# Patient Record
Sex: Female | Born: 1975 | Hispanic: Yes | Marital: Married | Smoking: Never smoker
Health system: Southern US, Community
[De-identification: ages and names within clinical notes are randomized; demographics above are authoritative.]

## PROBLEM LIST (undated history)

## (undated) DIAGNOSIS — E611 Iron deficiency: Secondary | ICD-10-CM

## (undated) DIAGNOSIS — E785 Hyperlipidemia, unspecified: Secondary | ICD-10-CM

## (undated) DIAGNOSIS — F419 Anxiety disorder, unspecified: Secondary | ICD-10-CM

## (undated) HISTORY — DX: Anxiety disorder, unspecified: F41.9

---

## 2000-06-06 HISTORY — PX: TUBAL LIGATION: SHX77

## 2004-05-14 ENCOUNTER — Ambulatory Visit: Payer: Self-pay

## 2007-04-19 ENCOUNTER — Ambulatory Visit: Payer: Self-pay | Admitting: Obstetrics and Gynecology

## 2007-04-30 ENCOUNTER — Ambulatory Visit: Payer: Self-pay | Admitting: Obstetrics and Gynecology

## 2008-05-15 ENCOUNTER — Ambulatory Visit: Payer: Self-pay | Admitting: Obstetrics and Gynecology

## 2008-10-15 ENCOUNTER — Ambulatory Visit: Payer: Self-pay | Admitting: Obstetrics and Gynecology

## 2009-08-04 ENCOUNTER — Ambulatory Visit: Payer: Self-pay | Admitting: Nephrology

## 2010-02-05 ENCOUNTER — Ambulatory Visit: Payer: Self-pay | Admitting: Family Medicine

## 2010-04-26 ENCOUNTER — Ambulatory Visit: Payer: Self-pay | Admitting: Unknown Physician Specialty

## 2010-09-21 ENCOUNTER — Emergency Department: Payer: Self-pay | Admitting: Emergency Medicine

## 2010-12-16 ENCOUNTER — Ambulatory Visit: Payer: Self-pay

## 2011-02-04 ENCOUNTER — Ambulatory Visit: Payer: Self-pay | Admitting: Family Medicine

## 2012-03-02 ENCOUNTER — Emergency Department: Payer: Self-pay | Admitting: Emergency Medicine

## 2012-03-02 LAB — COMPREHENSIVE METABOLIC PANEL
BUN: 10 mg/dL (ref 7–18)
Bilirubin,Total: 0.3 mg/dL (ref 0.2–1.0)
Chloride: 107 mmol/L (ref 98–107)
Co2: 26 mmol/L (ref 21–32)
Creatinine: 0.69 mg/dL (ref 0.60–1.30)
EGFR (African American): >60
EGFR (Non-African Amer.): >60
Osmolality: 283 (ref 275–301)
Potassium: 4.1 mmol/L (ref 3.5–5.1)
SGPT (ALT): 19 U/L (ref 12–78)

## 2012-03-02 LAB — URINALYSIS, COMPLETE
Bacteria: NONE SEEN
Bilirubin,UR: NEGATIVE
Ketone: NEGATIVE
Nitrite: NEGATIVE
Ph: 8 (ref 4.5–8.0)
Protein: NEGATIVE
RBC,UR: 4 /HPF (ref 0–5)
Squamous Epithelial: <1
WBC UR: <1 /HPF (ref 0–5)

## 2012-03-02 LAB — CBC
MCH: 30.8 pg (ref 26.0–34.0)
MCHC: 33.2 g/dL (ref 32.0–36.0)
RBC: 4.53 10*6/uL (ref 3.80–5.20)
WBC: 8.1 10*3/uL (ref 3.6–11.0)

## 2016-06-22 ENCOUNTER — Ambulatory Visit: Payer: Self-pay | Attending: Oncology

## 2016-07-18 ENCOUNTER — Ambulatory Visit: Payer: Self-pay | Attending: Oncology | Admitting: *Deleted

## 2016-07-18 ENCOUNTER — Ambulatory Visit
Admission: RE | Admit: 2016-07-18 | Discharge: 2016-07-18 | Disposition: A | Discharge status: Home / Self Care | Payer: Self-pay | Source: Ambulatory Visit | Attending: Oncology | Admitting: Oncology

## 2016-07-18 ENCOUNTER — Encounter (INDEPENDENT_AMBULATORY_CARE_PROVIDER_SITE_OTHER): Payer: Self-pay

## 2016-07-18 VITALS — BP 137/83 | HR 71 | Temp 97.6°F | Ht 62.21 in | Wt 131.0 lb

## 2016-07-18 DIAGNOSIS — N63 Unspecified lump in unspecified breast: Secondary | ICD-10-CM
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
## 2016-07-18 NOTE — Patient Instructions (Signed)
Gave patient hand-out, Women Staying Healthy, Active and Well from [facility], with education on breast health, pap smears, heart and colon health. 

## 2016-07-19 ENCOUNTER — Encounter: Payer: Self-pay | Admitting: *Deleted

## 2016-07-19 ENCOUNTER — Telehealth: Payer: Self-pay | Admitting: *Deleted

## 2016-07-19 NOTE — Telephone Encounter (Addendum)
Called patient via Myriam JacobsonHelen, the interpreter.  Left message for her to return my call.  I would like to discuss her normal mammogram results and offer a surgical consult for the palpable right breast mass.  Patient returned my call.  She would like a surgical consult.  She is scheduled to see Dr.  Evette CristalSankar on Thursday 07/21/16 at 2:30.  She is to arrive 30 minutes early to complete paperwork.  She is to take a photo ID and all her meds with her to the appointment.  Will follow-up per [facility] protocol.

## 2016-07-21 ENCOUNTER — Encounter: Payer: Self-pay | Admitting: General Surgery

## 2016-07-21 ENCOUNTER — Encounter: Payer: Self-pay | Admitting: *Deleted

## 2016-07-21 ENCOUNTER — Ambulatory Visit (INDEPENDENT_AMBULATORY_CARE_PROVIDER_SITE_OTHER): Payer: PRIVATE HEALTH INSURANCE | Admitting: General Surgery

## 2016-07-21 VITALS — BP 130/82 | HR 76 | Resp 12 | Ht 62.0 in | Wt 131.0 lb

## 2016-07-21 DIAGNOSIS — N6310 Unspecified lump in the right breast, unspecified quadrant: Secondary | ICD-10-CM | POA: Diagnosis not present

## 2016-07-21 NOTE — Progress Notes (Addendum)
Patient ID: Danielle Avery, female   DOB: 1976-05-02, 41 y.o.   MRN: [id number]  Chief Complaint  Patient presents with  . Breast Problem    HPI Danielle Avery is a 41 y.o. female.  who presents for a breast evaluation referred by Molli PoseySheena Lambert RN with [facility]. The most recent mammogram was done on 07-18-16 with ultrasound. She can feel something in the right breast noticed about several years ago. However, 2 months ago it started hurting, no nipple discharge. No pain at this time. The pain is a "squeezing" "twisting" pain. Right breast injury was about 5 years ago. Patient does perform regular self breast checks and does not get regular mammograms done.    Interpreter, Smithfield FoodsMarta Col, present for interview, exam and discussion. I have reviewed the history of present illness with the patient.  HPI  Past Medical History:  Diagnosis Date  . Anxiety     Past Surgical History:  Procedure Laterality Date  . TUBAL LIGATION  2002    Family History  Problem Relation Age of Onset  . Breast cancer Neg Hx     Social History Social History  Substance Use Topics  . Smoking status: Never Smoker  . Smokeless tobacco: Never Used  . Alcohol use No    Allergies  Allergen Reactions  . Penicillins Hives    Current Outpatient Prescriptions  Medication Sig Dispense Refill  . FLUoxetine (PROZAC) 40 MG capsule Take 40 mg by mouth daily.     Marland Kitchen. ibuprofen (ADVIL,MOTRIN) 400 MG tablet Take by mouth every 6 (six) hours as needed.      No current facility-administered medications for this visit.     Review of Systems Review of Systems  Constitutional: Negative.   Respiratory: Negative.   Cardiovascular: Negative.     Blood pressure 130/82, pulse 76, resp. rate 12, height 5\' 2"  (1.575 m), weight 131 lb (59.4 kg), last menstrual period 07/19/2016, SpO2 98 %.  Physical Exam Physical Exam  Constitutional: She is oriented to person, place, and time. She appears  well-developed and well-nourished.  HENT:  Mouth/Throat: Oropharynx is clear and moist.  Eyes: Conjunctivae are normal. No scleral icterus.  Neck: Neck supple.  Cardiovascular: Normal rate, regular rhythm and normal heart sounds.   Pulmonary/Chest: Effort normal and breath sounds normal. Right breast exhibits mass. Right breast exhibits no inverted nipple, no nipple discharge, no skin change and no tenderness. Left breast exhibits no inverted nipple, no mass, no nipple discharge, no skin change and no tenderness.    Lymphadenopathy:    She has no cervical adenopathy.    She has no axillary adenopathy.  Neurological: She is alert and oriented to person, place, and time.  Skin: Skin is warm and dry.  Psychiatric: Her behavior is normal.    Data Reviewed Mammogram and ultrasound reviewed  Assessment    Right breast mass. Normal imaging. By history lump has been there for few yrs. Only ecently she feels some intermittent pain. 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    Plan    Aleve or advil as needed for pain. Follow office visit in 5-6 months.   If pain becopmes more of an issue will plan for excision of the mass  This information has been scribed by Dorathy DaftMarsha Hatch RN, BSN,BC.   Benedict Kue G 08/16/2016, 3:35 PM

## 2016-07-21 NOTE — Patient Instructions (Addendum)
The patient is aware to call back for any questions or concerns. Aleve or advil as needed for pain. Follow office visit in 5-6 months.

## 2016-08-04 ENCOUNTER — Encounter: Payer: Self-pay | Admitting: *Deleted

## 2016-08-04 NOTE — Progress Notes (Signed)
Patient saw Dr. Evette CristalSankar and he agrees that findings are benign.  Patient to follow up with him in 6 months.

## 2016-10-25 ENCOUNTER — Other Ambulatory Visit: Payer: Self-pay | Admitting: Nurse Practitioner

## 2016-10-25 DIAGNOSIS — N6452 Nipple discharge: Secondary | ICD-10-CM

## 2016-10-26 ENCOUNTER — Other Ambulatory Visit: Payer: Self-pay | Admitting: Nurse Practitioner

## 2016-10-26 DIAGNOSIS — N6452 Nipple discharge: Secondary | ICD-10-CM

## 2016-11-01 ENCOUNTER — Ambulatory Visit
Admission: RE | Admit: 2016-11-01 | Discharge: 2016-11-01 | Disposition: A | Discharge status: Home / Self Care | Payer: No Typology Code available for payment source | Source: Ambulatory Visit | Attending: Nurse Practitioner | Admitting: Nurse Practitioner

## 2016-11-01 DIAGNOSIS — N6452 Nipple discharge: Secondary | ICD-10-CM

## 2016-12-14 ENCOUNTER — Encounter: Payer: Self-pay | Admitting: Emergency Medicine

## 2016-12-14 ENCOUNTER — Emergency Department
Admission: EM | Admit: 2016-12-14 | Discharge: 2016-12-14 | Disposition: A | Discharge status: Home / Self Care | Payer: No Typology Code available for payment source | Attending: Emergency Medicine | Admitting: Emergency Medicine

## 2016-12-14 DIAGNOSIS — Z79899 Other long term (current) drug therapy: Secondary | ICD-10-CM | POA: Insufficient documentation

## 2016-12-14 DIAGNOSIS — S161XXA Strain of muscle, fascia and tendon at neck level, initial encounter: Secondary | ICD-10-CM | POA: Diagnosis not present

## 2016-12-14 DIAGNOSIS — S39012A Strain of muscle, fascia and tendon of lower back, initial encounter: Secondary | ICD-10-CM | POA: Insufficient documentation

## 2016-12-14 DIAGNOSIS — S199XXA Unspecified injury of neck, initial encounter: Secondary | ICD-10-CM | POA: Diagnosis present

## 2016-12-14 DIAGNOSIS — Y939 Activity, unspecified: Secondary | ICD-10-CM | POA: Diagnosis not present

## 2016-12-14 DIAGNOSIS — Y999 Unspecified external cause status: Secondary | ICD-10-CM | POA: Diagnosis not present

## 2016-12-14 DIAGNOSIS — Y92410 Unspecified street and highway as the place of occurrence of the external cause: Secondary | ICD-10-CM | POA: Diagnosis not present

## 2016-12-14 MED ORDER — CYCLOBENZAPRINE HCL 5 MG PO TABS: 5.0000 mg | ORAL_TABLET | Freq: Three times a day (TID) | ORAL | 0 refills | Status: AC | PRN

## 2016-12-14 MED ORDER — IBUPROFEN 600 MG PO TABS: 600.0000 mg | ORAL_TABLET | Freq: Four times a day (QID) | ORAL | 0 refills | Status: AC | PRN

## 2016-12-14 NOTE — Discharge Instructions (Signed)
Please take medications as prescribed. Please follow-up with orthopedics if no improvement in 5-7 days. Return to the ER for any increasing pain, weakness, numbness, tingling, worsening symptoms or urgent changes in her health.

## 2016-12-14 NOTE — ED Notes (Signed)
Discussed discharge instructions, prescriptions, and follow-up care with patient. No questions or concerns at this time. Pt stable at discharge.  
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
## 2017-01-23 ENCOUNTER — Ambulatory Visit: Payer: PRIVATE HEALTH INSURANCE | Admitting: General Surgery

## 2017-01-25 ENCOUNTER — Ambulatory Visit (INDEPENDENT_AMBULATORY_CARE_PROVIDER_SITE_OTHER): Payer: PRIVATE HEALTH INSURANCE | Admitting: General Surgery

## 2017-01-25 ENCOUNTER — Encounter: Payer: Self-pay | Admitting: General Surgery

## 2017-01-25 ENCOUNTER — Encounter: Payer: Self-pay | Admitting: *Deleted

## 2017-01-25 VITALS — BP 124/72 | HR 78 | Resp 12 | Ht 62.0 in | Wt 141.0 lb

## 2017-01-25 DIAGNOSIS — N6311 Unspecified lump in the right breast, upper outer quadrant: Secondary | ICD-10-CM | POA: Diagnosis not present
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
## 2017-01-25 NOTE — Patient Instructions (Signed)
The patient is aware to call back for any questions or concerns.  

## 2017-08-16 ENCOUNTER — Ambulatory Visit
Admission: RE | Admit: 2017-08-16 | Discharge: 2017-08-16 | Disposition: A | Discharge status: Home / Self Care | Payer: No Typology Code available for payment source | Source: Ambulatory Visit | Attending: Oncology | Admitting: Oncology

## 2017-08-16 ENCOUNTER — Ambulatory Visit: Payer: Self-pay | Attending: Oncology | Admitting: *Deleted

## 2017-08-16 VITALS — BP 107/73 | HR 69 | Temp 98.8°F | Ht 61.0 in | Wt 127.0 lb

## 2017-08-16 DIAGNOSIS — Z0000 Encounter for general adult medical examination without abnormal findings: Secondary | ICD-10-CM

## 2017-08-16 NOTE — Progress Notes (Signed)
Subjective:     Patient ID: Jacklynn BarnacleMinerva Ousley, female   DOB: 06-07-1975, 42 y.o.   MRN: [id number]  HPI   Review of Systems     Objective:   Physical Exam  Pulmonary/Chest: Right breast exhibits no inverted nipple, no mass, no nipple discharge, no skin change and no tenderness. Left breast exhibits no inverted nipple, no mass, no nipple discharge, no skin change and no tenderness. Breasts are symmetrical.         Assessment:     42 year old Hispanic [facility]  Lloyda, the interpreter present during the interview and exam.  On clinical breast exam I can palpate bilateral thickening at the upper outer quadrants with greater thickening on the left upper outer quadrant.  The patient is currently on her menstrual cycle.  Taught self breast exam.  Patient has been screened for eligibility.  She does not have any insurance, Medicare or Medicaid.  She also meets financial eligibility.  Hand-out given on the Affordable Care Act.    Plan:     Will order bilateral screening mammogram.  Will have patient return in 2 weeks to repeat clinical breast exam.  [facility].  The patient is not sure if her last pap was in 2016, or 2017.  We will complete her pap smear at the time of her next clinical breast exam if needed.  Patient is agreeable to the plan.  Will follow-up per [facility] protocol.

## 2017-08-16 NOTE — Patient Instructions (Signed)
Gave patient hand-out, Women Staying Healthy, Active and Well from [facility], with education on breast health, pap smears, heart and colon health. 

## 2017-08-21 ENCOUNTER — Encounter: Payer: Self-pay | Admitting: *Deleted

## 2017-08-21 NOTE — Progress Notes (Signed)
Letter mailed from the [facility] to inform patient of her normal mammogram results.  Patient is to follow-up with annual screening in one year.  Patient is scheduled to return on 09/04/17 @ 11:30 for a repeat breast exam. HSIS to Lor[place].

## 2017-09-04 ENCOUNTER — Encounter: Payer: Self-pay | Admitting: *Deleted

## 2017-09-04 ENCOUNTER — Ambulatory Visit: Payer: Self-pay | Attending: Oncology

## 2017-09-04 DIAGNOSIS — N6489 Other specified disorders of breast: Secondary | ICD-10-CM

## 2017-09-04 NOTE — Progress Notes (Signed)
Patient returned for two week follow up [facility].  Her mammogram results from 08/16/17 were [facility] 1.  Results given to patient.  Palpated bilateral thickening upper outer quadrants, with left slightly more prominent.  Patient reports less tenderness now that she is not having menstrual cycle.  Visual teaching with breast chart.  Patient to return to clinic as needed.  Otherwise she will return for annual screening.  Danielle CooperLoyda Avery interpreted exam.

## 2018-07-09 ENCOUNTER — Other Ambulatory Visit: Payer: Self-pay | Admitting: Family Medicine

## 2018-07-09 DIAGNOSIS — Z1231 Encounter for screening mammogram for malignant neoplasm of breast: Secondary | ICD-10-CM

## 2018-08-02 ENCOUNTER — Other Ambulatory Visit: Payer: Self-pay | Admitting: Family Medicine

## 2018-08-02 DIAGNOSIS — N6311 Unspecified lump in the right breast, upper outer quadrant: Secondary | ICD-10-CM

## 2018-08-06 ENCOUNTER — Ambulatory Visit
Admission: RE | Admit: 2018-08-06 | Discharge: 2018-08-06 | Disposition: A | Discharge status: Home / Self Care | Payer: PRIVATE HEALTH INSURANCE | Source: Ambulatory Visit | Attending: Family Medicine | Admitting: Family Medicine

## 2018-08-06 DIAGNOSIS — N6311 Unspecified lump in the right breast, upper outer quadrant: Secondary | ICD-10-CM

## 2019-08-23 ENCOUNTER — Other Ambulatory Visit: Payer: Self-pay

## 2019-08-23 ENCOUNTER — Ambulatory Visit: Payer: Self-pay | Attending: Internal Medicine

## 2019-08-23 DIAGNOSIS — Z23 Encounter for immunization: Secondary | ICD-10-CM

## 2019-08-23 NOTE — Progress Notes (Signed)
   Covid-19 Vaccination Clinic  Name:  Danielle Avery    MRN: [id number] DOB: 11/13/1975  08/23/2019  [name] was observed post Covid-19 immunization for 15 minutes without incident. She was provided with Vaccine Information Sheet and instruction to access the V-Safe system.   [name] was instructed to call 911 with any severe reactions post vaccine: Marland Kitchen Difficulty breathing  . Swelling of face and throat  . A fast heartbeat  . A bad rash all over body  . Dizziness and weakness   Immunizations Administered    Name Date Dose VIS Date Route   Pfizer COVID-19 Vaccine 08/23/2019  7:00 PM 0.3 mL 05/17/2019 Intramuscular   Manufacturer: [employer], [employer]   Lot: [id number]   NDC: [address]-4

## 2019-09-05 ENCOUNTER — Other Ambulatory Visit: Payer: Self-pay

## 2019-09-05 DIAGNOSIS — Z1231 Encounter for screening mammogram for malignant neoplasm of breast: Secondary | ICD-10-CM

## 2019-09-06 ENCOUNTER — Ambulatory Visit
Admission: RE | Admit: 2019-09-06 | Discharge: 2019-09-06 | Disposition: A | Discharge status: Home / Self Care | Payer: BLUE CROSS/BLUE SHIELD | Source: Ambulatory Visit

## 2019-09-06 DIAGNOSIS — Z1231 Encounter for screening mammogram for malignant neoplasm of breast: Secondary | ICD-10-CM | POA: Insufficient documentation

## 2019-09-12 ENCOUNTER — Other Ambulatory Visit: Payer: Self-pay

## 2019-09-12 DIAGNOSIS — R928 Other abnormal and inconclusive findings on diagnostic imaging of breast: Secondary | ICD-10-CM

## 2019-09-12 DIAGNOSIS — R2232 Localized swelling, mass and lump, left upper limb: Secondary | ICD-10-CM

## 2019-09-13 ENCOUNTER — Ambulatory Visit: Payer: Self-pay

## 2019-09-19 ENCOUNTER — Ambulatory Visit
Admission: RE | Admit: 2019-09-19 | Discharge: 2019-09-19 | Disposition: A | Discharge status: Home / Self Care | Payer: BLUE CROSS/BLUE SHIELD | Source: Ambulatory Visit

## 2019-09-19 DIAGNOSIS — R2232 Localized swelling, mass and lump, left upper limb: Secondary | ICD-10-CM | POA: Insufficient documentation

## 2019-09-19 DIAGNOSIS — R928 Other abnormal and inconclusive findings on diagnostic imaging of breast: Secondary | ICD-10-CM | POA: Insufficient documentation

## 2019-09-23 ENCOUNTER — Other Ambulatory Visit: Payer: Self-pay

## 2019-09-23 ENCOUNTER — Other Ambulatory Visit: Payer: Self-pay | Admitting: Certified Registered Nurse Anesthetist

## 2019-09-23 DIAGNOSIS — R599 Enlarged lymph nodes, unspecified: Secondary | ICD-10-CM

## 2019-09-23 DIAGNOSIS — R591 Generalized enlarged lymph nodes: Secondary | ICD-10-CM

## 2019-11-11 ENCOUNTER — Ambulatory Visit
Admission: RE | Admit: 2019-11-11 | Discharge: 2019-11-11 | Disposition: A | Discharge status: Home / Self Care | Payer: BLUE CROSS/BLUE SHIELD | Source: Ambulatory Visit

## 2019-11-11 DIAGNOSIS — R599 Enlarged lymph nodes, unspecified: Secondary | ICD-10-CM | POA: Insufficient documentation

## 2020-02-26 ENCOUNTER — Other Ambulatory Visit: Payer: Self-pay | Admitting: Surgery

## 2020-03-09 ENCOUNTER — Other Ambulatory Visit: Payer: Self-pay | Admitting: Surgery

## 2020-03-09 DIAGNOSIS — R1031 Right lower quadrant pain: Secondary | ICD-10-CM

## 2020-03-13 ENCOUNTER — Other Ambulatory Visit: Payer: Self-pay

## 2020-03-13 ENCOUNTER — Ambulatory Visit
Admission: RE | Admit: 2020-03-13 | Discharge: 2020-03-13 | Disposition: A | Discharge status: Home / Self Care | Payer: BLUE CROSS/BLUE SHIELD | Source: Ambulatory Visit | Attending: Surgery | Admitting: Surgery

## 2020-03-13 DIAGNOSIS — R1031 Right lower quadrant pain: Secondary | ICD-10-CM | POA: Insufficient documentation

## 2020-05-18 ENCOUNTER — Ambulatory Visit: Payer: BLUE CROSS/BLUE SHIELD | Attending: Internal Medicine

## 2020-05-18 DIAGNOSIS — Z23 Encounter for immunization: Secondary | ICD-10-CM

## 2020-05-18 NOTE — Progress Notes (Signed)
   Covid-19 Vaccination Clinic  Name:  Sahvannah Rieser    MRN: [id number] DOB: Mar 21, 1976  05/18/2020  Ms. Coggins was observed post Covid-19 immunization for 15 minutes without incident. She was provided with Vaccine Information Sheet and instruction to access the V-Safe system.   Ms. Hernan was instructed to call 911 with any severe reactions post vaccine: Marland Kitchen Difficulty breathing  . Swelling of face and throat  . A fast heartbeat  . A bad rash all over body  . Dizziness and weakness   Immunizations Administered    Name Date Dose VIS Date Route   Pfizer COVID-19 Vaccine 05/18/2020  3:54 PM 0.3 mL 03/25/2020 Intramuscular   Manufacturer: [employer], [employer]   Lot: [id number]   NDC: [address]-2

## 2020-07-23 ENCOUNTER — Other Ambulatory Visit: Payer: Self-pay

## 2020-07-23 DIAGNOSIS — Z1231 Encounter for screening mammogram for malignant neoplasm of breast: Secondary | ICD-10-CM

## 2020-08-26 ENCOUNTER — Other Ambulatory Visit: Payer: Self-pay | Admitting: Family Medicine

## 2020-08-26 DIAGNOSIS — R2232 Localized swelling, mass and lump, left upper limb: Secondary | ICD-10-CM

## 2020-09-07 ENCOUNTER — Other Ambulatory Visit: Payer: Self-pay

## 2020-09-07 ENCOUNTER — Ambulatory Visit
Admission: RE | Admit: 2020-09-07 | Discharge: 2020-09-07 | Disposition: A | Discharge status: Home / Self Care | Payer: 59 | Source: Ambulatory Visit

## 2020-09-07 DIAGNOSIS — Z1231 Encounter for screening mammogram for malignant neoplasm of breast: Secondary | ICD-10-CM | POA: Diagnosis not present

## 2020-10-20 ENCOUNTER — Other Ambulatory Visit: Payer: Self-pay

## 2020-10-20 ENCOUNTER — Ambulatory Visit
Admission: RE | Admit: 2020-10-20 | Discharge: 2020-10-20 | Disposition: A | Discharge status: Home / Self Care | Payer: 59 | Source: Ambulatory Visit | Attending: Family Medicine | Admitting: Family Medicine

## 2020-10-20 DIAGNOSIS — R2232 Localized swelling, mass and lump, left upper limb: Secondary | ICD-10-CM | POA: Diagnosis not present

## 2021-03-05 ENCOUNTER — Ambulatory Visit: Payer: 59 | Admitting: Anesthesiology

## 2021-03-05 ENCOUNTER — Encounter
Admission: RE | Disposition: A | Discharge status: Home / Self Care | Payer: Self-pay | Source: Home / Self Care | Attending: Gastroenterology

## 2021-03-05 ENCOUNTER — Ambulatory Visit
Admission: RE | Admit: 2021-03-05 | Discharge: 2021-03-05 | Disposition: A | Discharge status: Home / Self Care | Payer: 59 | Attending: Gastroenterology | Admitting: Gastroenterology

## 2021-03-05 DIAGNOSIS — Z1211 Encounter for screening for malignant neoplasm of colon: Secondary | ICD-10-CM | POA: Insufficient documentation

## 2021-03-05 DIAGNOSIS — Z880 Allergy status to penicillin: Secondary | ICD-10-CM | POA: Insufficient documentation

## 2021-03-05 HISTORY — DX: Hyperlipidemia, unspecified: E78.5

## 2021-03-05 HISTORY — DX: Iron deficiency: E61.1

## 2021-03-05 HISTORY — PX: COLONOSCOPY WITH PROPOFOL: SHX5780

## 2021-03-05 LAB — POCT PREGNANCY, URINE: Preg Test, Ur: NEGATIVE

## 2021-03-05 SURGERY — COLONOSCOPY WITH PROPOFOL
Anesthesia: General

## 2021-03-05 MED ORDER — SODIUM CHLORIDE 0.9 % IV SOLN
INTRAVENOUS | Status: DC
  Administered 2021-03-05: 20 mL/h via INTRAVENOUS

## 2021-03-05 MED ORDER — PROPOFOL 10 MG/ML IV BOLUS
INTRAVENOUS | Status: DC | PRN
Start: 2021-03-05 — End: 2021-03-05
  Administered 2021-03-05: 70 mg via INTRAVENOUS
  Administered 2021-03-05: 20 mg via INTRAVENOUS
  Administered 2021-03-05: 40 mg via INTRAVENOUS
  Administered 2021-03-05 (×2): 30 mg via INTRAVENOUS

## 2021-03-05 MED ORDER — DEXMEDETOMIDINE (PRECEDEX) IN NS 20 MCG/5ML (4 MCG/ML) IV SYRINGE
PREFILLED_SYRINGE | INTRAVENOUS | Status: DC | PRN
  Administered 2021-03-05: 8 ug via INTRAVENOUS

## 2021-03-05 MED ORDER — PROPOFOL 500 MG/50ML IV EMUL
INTRAVENOUS | Status: DC | PRN
  Administered 2021-03-05: 140 ug/kg/min via INTRAVENOUS

## 2021-03-05 NOTE — Op Note (Signed)
[facility] Gastroenterology Patient Name: Danielle Avery Procedure Date: 03/05/2021 11:39 AM MRN: [id number] Account #: [id number] Date of Birth: 1976-06-05 Admit Type: Outpatient Age: 45 Room: [facility], In ENDO ROOM 3 Gender: Female Note Status: Finalized Instrument Name: Jasper Riling [id number] Procedure:             Colonoscopy Indications:           Screening for colorectal malignant neoplasm Providers:             Andrey Farmer MD, MD Medicines:             Monitored Anesthesia Care Complications:         No immediate complications. Procedure:             Pre-Anesthesia Assessment:                        - Prior to the procedure, a History and Physical was                         performed, and patient medications and allergies were                         reviewed. The patient is competent. The risks and                         benefits of the procedure and the sedation options and                         risks were discussed with the patient. All questions                         were answered and informed consent was obtained.                         Patient identification and proposed procedure were                         verified by the physician, the nurse, the anesthetist                         and the technician in the endoscopy suite. Mental                         Status Examination: alert and oriented. Airway                         Examination: normal oropharyngeal airway and neck                         mobility. Respiratory Examination: clear to                         auscultation. CV Examination: normal. Prophylactic                         Antibiotics: The patient does not require prophylactic                         antibiotics. Prior Anticoagulants: The patient has  taken no previous anticoagulant or antiplatelet                         agents. ASA Grade Assessment: I - A normal, healthy                         patient.  After reviewing the risks and benefits, the                         patient was deemed in satisfactory condition to                         undergo the procedure. The anesthesia plan was to use                         monitored anesthesia care (MAC). Immediately prior to                         administration of medications, the patient was                         re-assessed for adequacy to receive sedatives. The                         heart rate, respiratory rate, oxygen saturations,                         blood pressure, adequacy of pulmonary ventilation, and                         response to care were monitored throughout the                         procedure. The physical status of the patient was                         re-assessed after the procedure.                        After obtaining informed consent, the colonoscope was                         passed under direct vision. Throughout the procedure,                         the patient's blood pressure, pulse, and oxygen                         saturations were monitored continuously. The                         Colonoscope was introduced through the anus and                         advanced to the the cecum, identified by appendiceal                         orifice and ileocecal valve. The colonoscopy was  performed without difficulty. The patient tolerated                         the procedure well. The quality of the bowel                         preparation was good. Findings:      The perianal and digital rectal examinations were normal.      The entire examined colon appeared normal on direct and retroflexion       views. Impression:            - The entire examined colon is normal on direct and                         retroflexion views.                        - No specimens collected. Recommendation:        - Discharge patient to home.                        - Resume previous diet.                         - Continue present medications.                        - Repeat colonoscopy in 10 years for screening                         purposes.                        - Return to referring physician as previously                         scheduled. Procedure Code(s):     --- Professional ---                        C0034, Colorectal cancer screening; colonoscopy on                         individual not meeting criteria for high risk Diagnosis Code(s):     --- Professional ---                        Z12.11, Encounter for screening for malignant neoplasm                         of colon CPT copyright 2019 [employer]. All rights reserved. The codes documented in this report are preliminary and upon coder review may  be revised to meet current compliance requirements. Andrey Farmer MD, MD 03/05/2021 12:13:58 PM Number of Addenda: 0 Note Initiated On: 03/05/2021 11:39 AM Scope Withdrawal Time: 0 hours 6 minutes 50 seconds  Total Procedure Duration: 0 hours 10 minutes 44 seconds  Estimated Blood Loss:  Estimated blood loss: none.      [facility]

## 2021-03-05 NOTE — H&P (Signed)
Outpatient short stay form Pre-procedure 03/05/2021  Regis Bill, MD  Primary Physician: [facility]  Reason for visit:  Merlyn Lot MD, MPH  History of present illness:   45 y/o lady with no significant PMH here for screening colonoscopy. No blood thinners. No family history of GI malignancies. History of tubal ligation.     Current Facility-Administered Medications:    0.9 %  sodium chloride infusion, , Intravenous, Continuous, Mikhala Kenan, Rossie Muskrat, MD, Last Rate: 20 mL/hr at 03/05/21 1130, 20 mL/hr at 03/05/21 1130  Medications Prior to Admission  Medication Sig Dispense Refill Last Dose   cyclobenzaprine (FLEXERIL) 5 MG tablet Take 1-2 tablets (5-10 mg total) by mouth 3 (three) times daily as needed for muscle spasms. 20 tablet 0 Past Week   ibuprofen (ADVIL,MOTRIN) 600 MG tablet Take 1 tablet (600 mg total) by mouth every 6 (six) hours as needed for moderate pain. 30 tablet 0 Past Week     Allergies  Allergen Reactions   Penicillins Hives     Past Medical History:  Diagnosis Date   Anxiety    Hyperlipidemia    Iron deficiency     Review of systems:  Otherwise negative.    Physical Exam  Gen: Alert, oriented. Appears stated age.  HEENT: PERRLA. Lungs: No respiratory distress CV: RRR Abd: soft, benign, no masses Ext: No edema    Planned procedures: Proceed with colonoscopy. The patient understands the nature of the planned procedure, indications, risks, alternatives and potential complications including but not limited to bleeding, infection, perforation, damage to internal organs and possible oversedation/side effects from anesthesia. The patient agrees and gives consent to proceed.  Please refer to procedure notes for findings, recommendations and patient disposition/instructions.     Regis Bill, MD [facility]

## 2021-03-05 NOTE — Interval H&P Note (Signed)
History and Physical Interval Note:  03/05/2021 11:48 AM  Danielle Avery  has presented today for surgery, with the diagnosis of COLON CANCER SCREENING.  The various methods of treatment have been discussed with the patient and family. After consideration of risks, benefits and other options for treatment, the patient has consented to  Procedure(s) with comments: COLONOSCOPY WITH PROPOFOL (N/A) - SPANISH INTERPRETER as a surgical intervention.  The patient's history has been reviewed, patient examined, no change in status, stable for surgery.  I have reviewed the patient's chart and labs.  Questions were answered to the patient's satisfaction.     Regis Bill  Ok to proceed with colonoscopy

## 2021-03-05 NOTE — Anesthesia Postprocedure Evaluation (Signed)
Anesthesia Post Note  Patient: Danielle Avery  Procedure(s) Performed: COLONOSCOPY WITH PROPOFOL  Patient location during evaluation: Endoscopy Anesthesia Type: General Level of consciousness: awake and alert Pain management: pain level controlled Vital Signs Assessment: post-procedure vital signs reviewed and stable Respiratory status: spontaneous breathing, nonlabored ventilation and respiratory function stable Cardiovascular status: blood pressure returned to baseline and stable Postop Assessment: no apparent nausea or vomiting Anesthetic complications: no   No notable events documented.   Last Vitals:  Vitals:   03/05/21 1222 03/05/21 1232  BP: (!) 96/47 (!) 98/55  Pulse:    Resp: 16   Temp:    SpO2:      Last Pain:  Vitals:   03/05/21 1232  TempSrc:   PainSc: 0-No pain                 Foye Deer

## 2021-03-05 NOTE — Transfer of Care (Signed)
Immediate Anesthesia Transfer of Care Note  Patient: Danielle Avery  Procedure(s) Performed: COLONOSCOPY WITH PROPOFOL  Patient Location: PACU  Anesthesia Type:General  Level of Consciousness: awake, alert  and oriented  Airway & Oxygen Therapy: Patient Spontanous Breathing  Post-op Assessment: Report given to RN and Post -op Vital signs reviewed and stable  Post vital signs: Reviewed and stable  Last Vitals:  Vitals Value Taken Time  BP 85/54 03/05/21 1212  Temp    Pulse 66 03/05/21 1212  Resp 13 03/05/21 1212  SpO2 97 % 03/05/21 1212  Vitals shown include unvalidated device data.  Last Pain:  Vitals:   03/05/21 1117  TempSrc: Temporal         Complications: No notable events documented.

## 2021-03-05 NOTE — Anesthesia Preprocedure Evaluation (Addendum)
Anesthesia Evaluation  Patient identified by MRN, date of birth, ID band Patient awake    Reviewed: Allergy & Precautions, NPO status , Patient's Chart, lab work & pertinent test results  Airway Mallampati: III  TM Distance: >3 FB Neck ROM: Full    Dental no notable dental hx.    Pulmonary neg pulmonary ROS,    Pulmonary exam normal        Cardiovascular negative cardio ROS Normal cardiovascular exam     Neuro/Psych Anxiety negative neurological ROS     GI/Hepatic negative GI ROS, Neg liver ROS,   Endo/Other  negative endocrine ROS  Renal/GU negative Renal ROS  negative genitourinary   Musculoskeletal   Abdominal Normal abdominal exam  (+)   Peds  Hematology negative hematology ROS (+)   Anesthesia Other Findings Past Medical History: No date: Anxiety No date: Hyperlipidemia No date: Iron deficiency  Past Surgical History: 2002: TUBAL LIGATION     Reproductive/Obstetrics negative OB ROS                            Anesthesia Physical Anesthesia Plan  ASA: 2  Anesthesia Plan: General   Post-op Pain Management:    Induction:   PONV Risk Score and Plan: 3 and Propofol infusion, TIVA and Treatment may vary due to age or medical condition  Airway Management Planned:   Additional Equipment:   Intra-op Plan:   Post-operative Plan:   Informed Consent: I have reviewed the patients History and Physical, chart, labs and discussed the procedure including the risks, benefits and alternatives for the proposed anesthesia with the patient or authorized representative who has indicated his/her understanding and acceptance.     Dental advisory given  Plan Discussed with: Anesthesiologist, CRNA and Surgeon  Anesthesia Plan Comments:        Anesthesia Quick Evaluation

## 2021-03-08 ENCOUNTER — Encounter: Payer: Self-pay | Admitting: Gastroenterology

## 2021-07-20 IMAGING — MG DIGITAL SCREENING BILAT W/ TOMO W/ CAD
8 series · 8 of 24 positions shown · non-contrast
Comparison: Previous exam(s).

CLINICAL DATA: Screening. Patient had 7M3T9-FT vaccine 08/20/2019
left arm.

EXAM:
DIGITAL SCREENING BILATERAL MAMMOGRAM WITH TOMO AND CAD

[R MLO synth-2D]
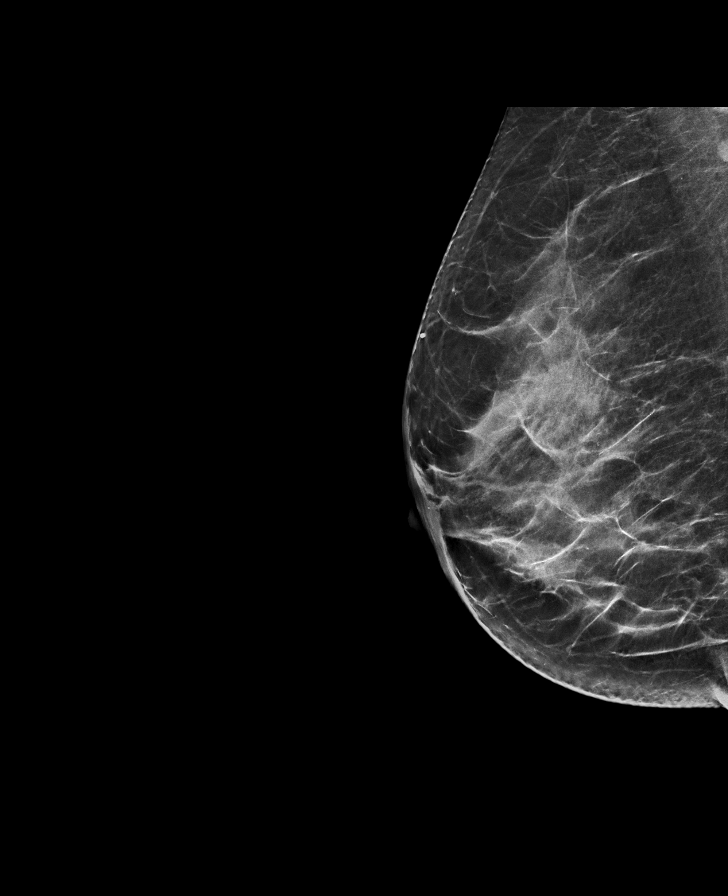

[L MLO synth-2D]
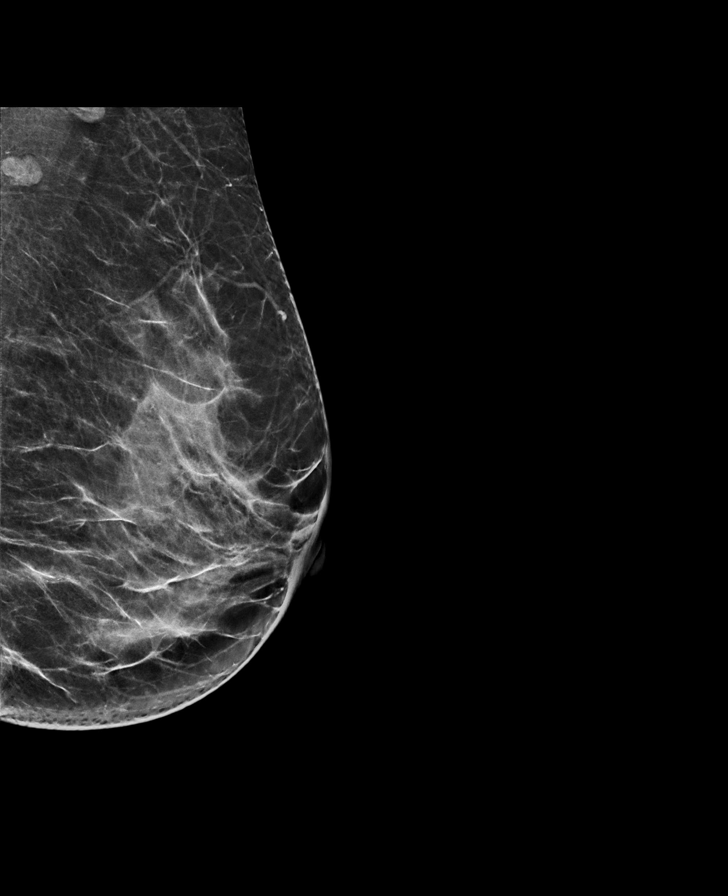

[R CC synth-2D]
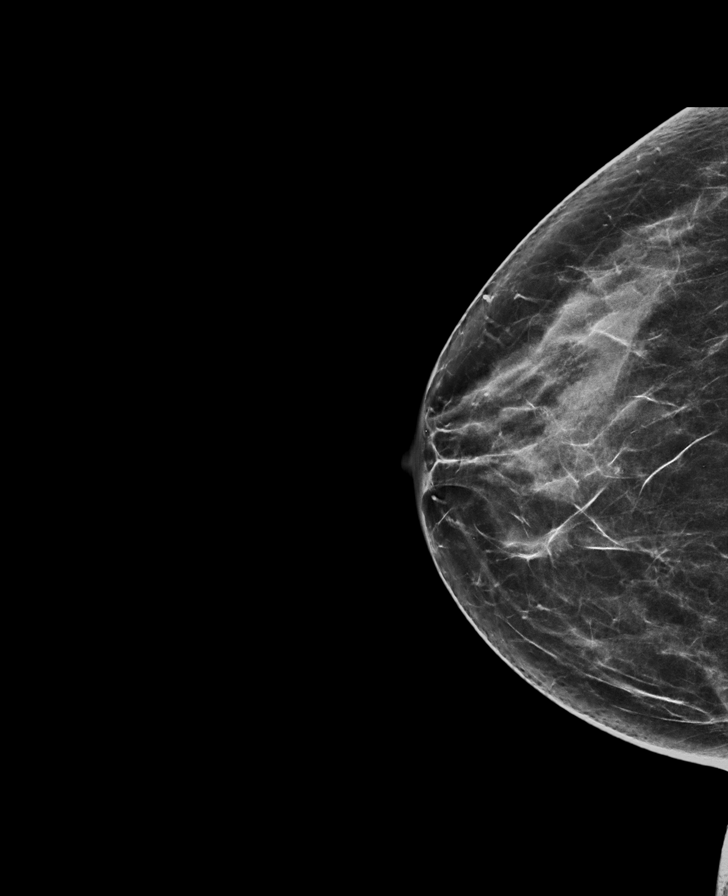

[L CC synth-2D]
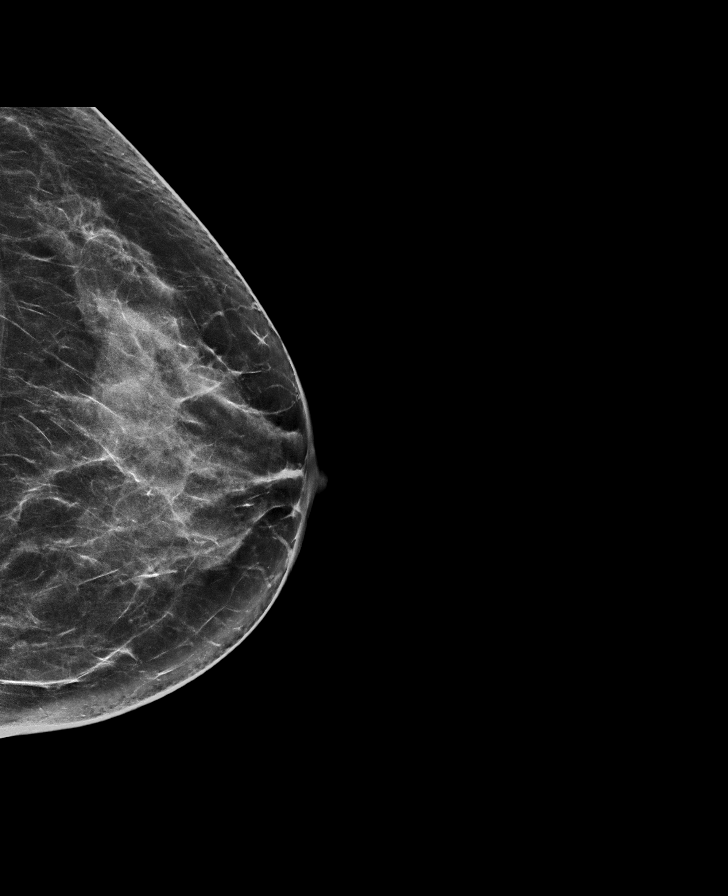

[R CC tomo · tomo slice 35/70.0]
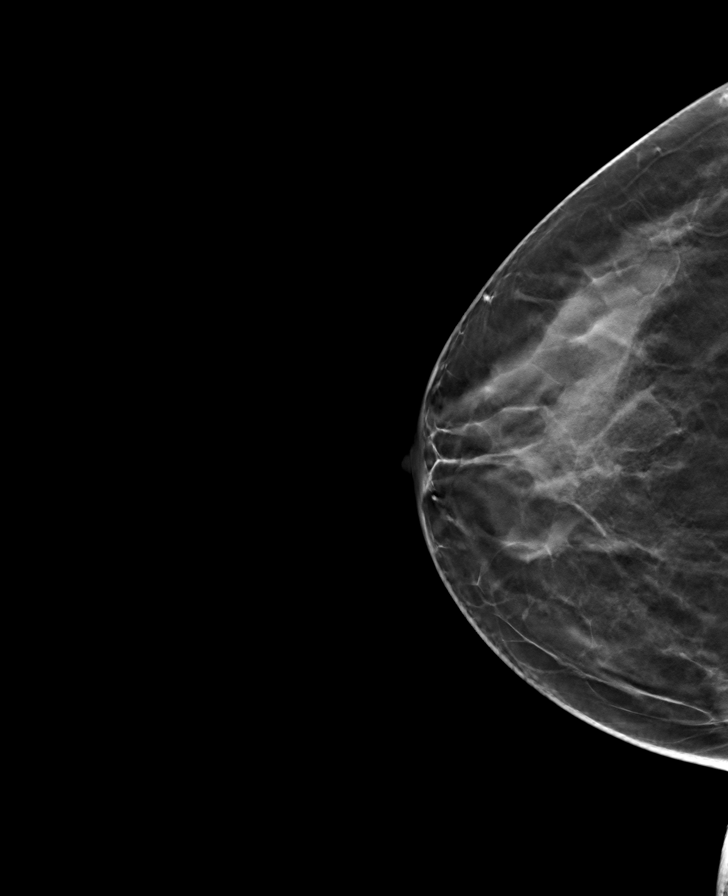

[L CC tomo · tomo slice 37/72.0]
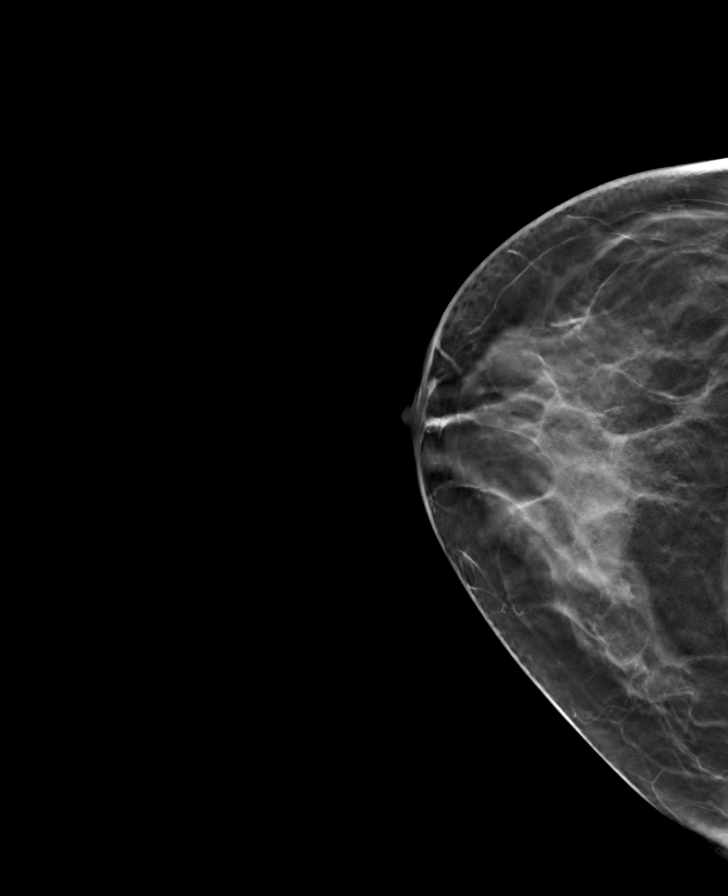

[R MLO tomo · tomo slice 35/70.0]
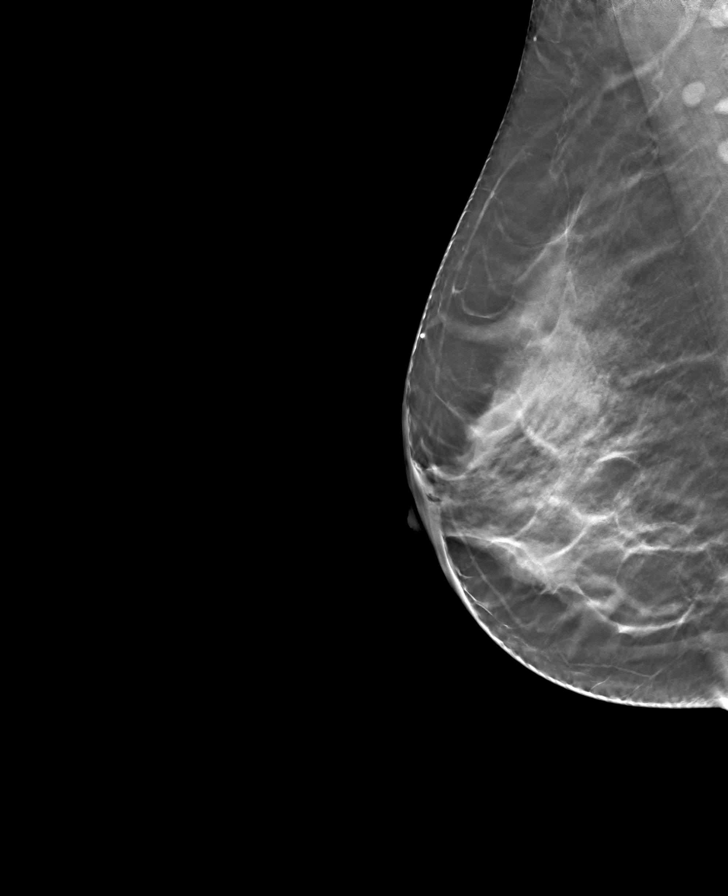

[L MLO tomo · tomo slice 34/67.0]
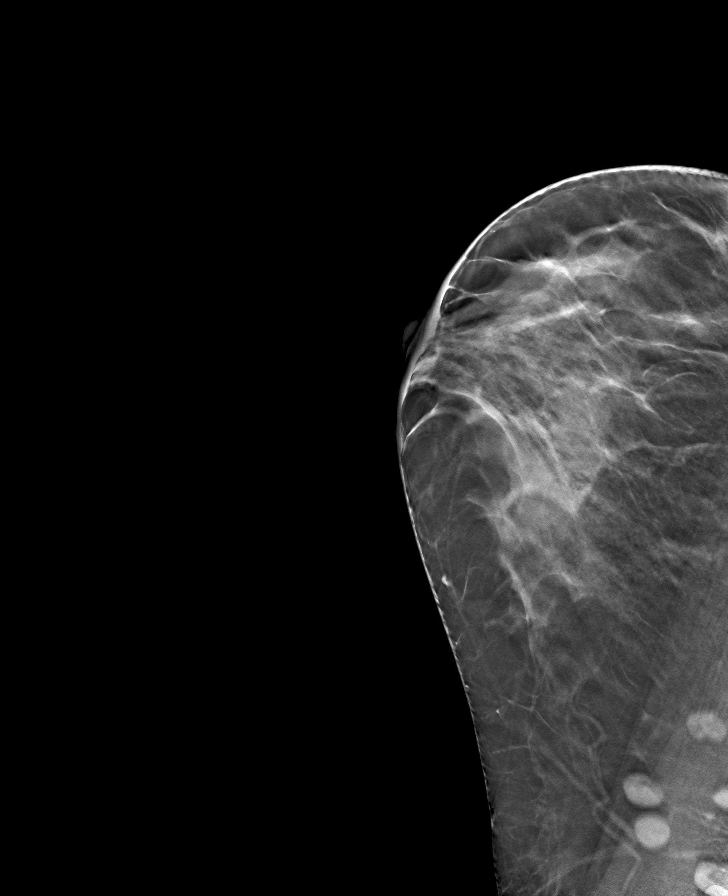

[8 of 24 positions shown; findings below may reference images not displayed]

ACR Breast Density Category c: The breast tissue is heterogeneously
dense, which may obscure small masses.
FINDINGS: In the left axilla, possible adenopathy warrants further evaluation
as this is likely related patient's recent left arm 7M3T9-FT
vaccination. In the right breast, no findings suspicious for
malignancy.

Images were processed with CAD.
IMPRESSION: Further evaluation is suggested for possible mass in the left
axilla.

RECOMMENDATION:
Ultrasound of the left axilla. (Code:[id number])

The patient will be contacted regarding the findings, and additional
imaging will be scheduled.

BI-RADS CATEGORY  0: Incomplete. Need additional imaging evaluation
and/or prior mammograms for comparison.

## 2021-08-11 ENCOUNTER — Other Ambulatory Visit: Payer: Self-pay

## 2021-08-11 DIAGNOSIS — Z1231 Encounter for screening mammogram for malignant neoplasm of breast: Secondary | ICD-10-CM

## 2021-09-21 ENCOUNTER — Ambulatory Visit
Admission: RE | Admit: 2021-09-21 | Discharge: 2021-09-21 | Disposition: A | Discharge status: Home / Self Care | Payer: 59 | Source: Ambulatory Visit

## 2021-09-21 DIAGNOSIS — Z1231 Encounter for screening mammogram for malignant neoplasm of breast: Secondary | ICD-10-CM | POA: Insufficient documentation

## 2021-09-23 ENCOUNTER — Ambulatory Visit: Payer: Medicaid Other

## 2021-09-27 DIAGNOSIS — Z01419 Encounter for gynecological examination (general) (routine) without abnormal findings: Secondary | ICD-10-CM | POA: Diagnosis not present

## 2022-02-16 DIAGNOSIS — M533 Sacrococcygeal disorders, not elsewhere classified: Secondary | ICD-10-CM | POA: Diagnosis not present

## 2022-02-16 DIAGNOSIS — M5416 Radiculopathy, lumbar region: Secondary | ICD-10-CM | POA: Diagnosis not present

## 2022-02-16 DIAGNOSIS — M9904 Segmental and somatic dysfunction of sacral region: Secondary | ICD-10-CM | POA: Diagnosis not present

## 2022-02-16 DIAGNOSIS — M530 Cervicocranial syndrome: Secondary | ICD-10-CM | POA: Diagnosis not present

## 2022-02-16 DIAGNOSIS — M9903 Segmental and somatic dysfunction of lumbar region: Secondary | ICD-10-CM | POA: Diagnosis not present

## 2022-02-16 DIAGNOSIS — M9901 Segmental and somatic dysfunction of cervical region: Secondary | ICD-10-CM | POA: Diagnosis not present

## 2022-02-18 DIAGNOSIS — M9901 Segmental and somatic dysfunction of cervical region: Secondary | ICD-10-CM | POA: Diagnosis not present

## 2022-02-18 DIAGNOSIS — M9903 Segmental and somatic dysfunction of lumbar region: Secondary | ICD-10-CM | POA: Diagnosis not present

## 2022-02-18 DIAGNOSIS — M533 Sacrococcygeal disorders, not elsewhere classified: Secondary | ICD-10-CM | POA: Diagnosis not present

## 2022-02-18 DIAGNOSIS — M5416 Radiculopathy, lumbar region: Secondary | ICD-10-CM | POA: Diagnosis not present

## 2022-02-18 DIAGNOSIS — M530 Cervicocranial syndrome: Secondary | ICD-10-CM | POA: Diagnosis not present

## 2022-02-18 DIAGNOSIS — M9904 Segmental and somatic dysfunction of sacral region: Secondary | ICD-10-CM | POA: Diagnosis not present

## 2022-02-21 DIAGNOSIS — H60501 Unspecified acute noninfective otitis externa, right ear: Secondary | ICD-10-CM | POA: Diagnosis not present

## 2022-02-21 DIAGNOSIS — H6980 Other specified disorders of Eustachian tube, unspecified ear: Secondary | ICD-10-CM | POA: Diagnosis not present

## 2022-02-21 DIAGNOSIS — R051 Acute cough: Secondary | ICD-10-CM | POA: Diagnosis not present

## 2022-02-21 DIAGNOSIS — J019 Acute sinusitis, unspecified: Secondary | ICD-10-CM | POA: Diagnosis not present

## 2022-03-02 DIAGNOSIS — M9901 Segmental and somatic dysfunction of cervical region: Secondary | ICD-10-CM | POA: Diagnosis not present

## 2022-03-02 DIAGNOSIS — M9903 Segmental and somatic dysfunction of lumbar region: Secondary | ICD-10-CM | POA: Diagnosis not present

## 2022-03-02 DIAGNOSIS — M9904 Segmental and somatic dysfunction of sacral region: Secondary | ICD-10-CM | POA: Diagnosis not present

## 2022-03-02 DIAGNOSIS — M533 Sacrococcygeal disorders, not elsewhere classified: Secondary | ICD-10-CM | POA: Diagnosis not present

## 2022-03-02 DIAGNOSIS — M530 Cervicocranial syndrome: Secondary | ICD-10-CM | POA: Diagnosis not present

## 2022-03-02 DIAGNOSIS — M5416 Radiculopathy, lumbar region: Secondary | ICD-10-CM | POA: Diagnosis not present

## 2022-03-14 DIAGNOSIS — E782 Mixed hyperlipidemia: Secondary | ICD-10-CM | POA: Diagnosis not present

## 2022-03-14 DIAGNOSIS — Z131 Encounter for screening for diabetes mellitus: Secondary | ICD-10-CM | POA: Diagnosis not present

## 2022-03-14 DIAGNOSIS — Z79899 Other long term (current) drug therapy: Secondary | ICD-10-CM | POA: Diagnosis not present

## 2022-03-21 DIAGNOSIS — Z23 Encounter for immunization: Secondary | ICD-10-CM | POA: Diagnosis not present

## 2022-03-21 DIAGNOSIS — R0789 Other chest pain: Secondary | ICD-10-CM | POA: Diagnosis not present

## 2022-03-21 DIAGNOSIS — R69 Illness, unspecified: Secondary | ICD-10-CM | POA: Diagnosis not present

## 2022-03-21 DIAGNOSIS — E782 Mixed hyperlipidemia: Secondary | ICD-10-CM | POA: Diagnosis not present

## 2022-03-21 DIAGNOSIS — E611 Iron deficiency: Secondary | ICD-10-CM | POA: Diagnosis not present

## 2022-03-21 DIAGNOSIS — Z79899 Other long term (current) drug therapy: Secondary | ICD-10-CM | POA: Diagnosis not present

## 2022-03-21 DIAGNOSIS — R7309 Other abnormal glucose: Secondary | ICD-10-CM | POA: Diagnosis not present

## 2022-03-21 DIAGNOSIS — Z0000 Encounter for general adult medical examination without abnormal findings: Secondary | ICD-10-CM | POA: Diagnosis not present

## 2022-03-30 DIAGNOSIS — L578 Other skin changes due to chronic exposure to nonionizing radiation: Secondary | ICD-10-CM | POA: Diagnosis not present

## 2022-03-30 DIAGNOSIS — L738 Other specified follicular disorders: Secondary | ICD-10-CM | POA: Diagnosis not present

## 2022-03-30 DIAGNOSIS — I781 Nevus, non-neoplastic: Secondary | ICD-10-CM | POA: Diagnosis not present

## 2022-05-09 DIAGNOSIS — R0789 Other chest pain: Secondary | ICD-10-CM | POA: Diagnosis not present

## 2022-07-15 DIAGNOSIS — M533 Sacrococcygeal disorders, not elsewhere classified: Secondary | ICD-10-CM | POA: Diagnosis not present

## 2022-07-15 DIAGNOSIS — M9901 Segmental and somatic dysfunction of cervical region: Secondary | ICD-10-CM | POA: Diagnosis not present

## 2022-07-15 DIAGNOSIS — M9904 Segmental and somatic dysfunction of sacral region: Secondary | ICD-10-CM | POA: Diagnosis not present

## 2022-07-15 DIAGNOSIS — M530 Cervicocranial syndrome: Secondary | ICD-10-CM | POA: Diagnosis not present

## 2022-07-15 DIAGNOSIS — M5416 Radiculopathy, lumbar region: Secondary | ICD-10-CM | POA: Diagnosis not present

## 2022-07-15 DIAGNOSIS — M9903 Segmental and somatic dysfunction of lumbar region: Secondary | ICD-10-CM | POA: Diagnosis not present

## 2022-08-12 DIAGNOSIS — M533 Sacrococcygeal disorders, not elsewhere classified: Secondary | ICD-10-CM | POA: Diagnosis not present

## 2022-08-12 DIAGNOSIS — M9901 Segmental and somatic dysfunction of cervical region: Secondary | ICD-10-CM | POA: Diagnosis not present

## 2022-08-12 DIAGNOSIS — M5416 Radiculopathy, lumbar region: Secondary | ICD-10-CM | POA: Diagnosis not present

## 2022-08-12 DIAGNOSIS — M9903 Segmental and somatic dysfunction of lumbar region: Secondary | ICD-10-CM | POA: Diagnosis not present

## 2022-08-12 DIAGNOSIS — M9904 Segmental and somatic dysfunction of sacral region: Secondary | ICD-10-CM | POA: Diagnosis not present

## 2022-08-12 DIAGNOSIS — M530 Cervicocranial syndrome: Secondary | ICD-10-CM | POA: Diagnosis not present

## 2022-08-17 ENCOUNTER — Other Ambulatory Visit: Payer: Self-pay | Admitting: Internal Medicine

## 2022-08-17 DIAGNOSIS — Z1231 Encounter for screening mammogram for malignant neoplasm of breast: Secondary | ICD-10-CM

## 2022-09-09 DIAGNOSIS — M533 Sacrococcygeal disorders, not elsewhere classified: Secondary | ICD-10-CM | POA: Diagnosis not present

## 2022-09-09 DIAGNOSIS — M5416 Radiculopathy, lumbar region: Secondary | ICD-10-CM | POA: Diagnosis not present

## 2022-09-09 DIAGNOSIS — M9904 Segmental and somatic dysfunction of sacral region: Secondary | ICD-10-CM | POA: Diagnosis not present

## 2022-09-09 DIAGNOSIS — M9901 Segmental and somatic dysfunction of cervical region: Secondary | ICD-10-CM | POA: Diagnosis not present

## 2022-09-09 DIAGNOSIS — M530 Cervicocranial syndrome: Secondary | ICD-10-CM | POA: Diagnosis not present

## 2022-09-09 DIAGNOSIS — M9903 Segmental and somatic dysfunction of lumbar region: Secondary | ICD-10-CM | POA: Diagnosis not present

## 2022-09-22 DIAGNOSIS — E782 Mixed hyperlipidemia: Secondary | ICD-10-CM | POA: Diagnosis not present

## 2022-09-22 DIAGNOSIS — Z79899 Other long term (current) drug therapy: Secondary | ICD-10-CM | POA: Diagnosis not present

## 2022-09-22 DIAGNOSIS — R7309 Other abnormal glucose: Secondary | ICD-10-CM | POA: Diagnosis not present

## 2022-09-27 ENCOUNTER — Ambulatory Visit
Admission: RE | Admit: 2022-09-27 | Discharge: 2022-09-27 | Disposition: A | Discharge status: Home / Self Care | Payer: 59 | Source: Ambulatory Visit | Attending: Internal Medicine | Admitting: Internal Medicine

## 2022-09-27 DIAGNOSIS — Z1231 Encounter for screening mammogram for malignant neoplasm of breast: Secondary | ICD-10-CM | POA: Diagnosis not present

## 2022-10-07 DIAGNOSIS — M9901 Segmental and somatic dysfunction of cervical region: Secondary | ICD-10-CM | POA: Diagnosis not present

## 2022-10-07 DIAGNOSIS — M9903 Segmental and somatic dysfunction of lumbar region: Secondary | ICD-10-CM | POA: Diagnosis not present

## 2022-10-07 DIAGNOSIS — M530 Cervicocranial syndrome: Secondary | ICD-10-CM | POA: Diagnosis not present

## 2022-10-07 DIAGNOSIS — M533 Sacrococcygeal disorders, not elsewhere classified: Secondary | ICD-10-CM | POA: Diagnosis not present

## 2022-10-07 DIAGNOSIS — M5416 Radiculopathy, lumbar region: Secondary | ICD-10-CM | POA: Diagnosis not present

## 2022-10-07 DIAGNOSIS — M9904 Segmental and somatic dysfunction of sacral region: Secondary | ICD-10-CM | POA: Diagnosis not present

## 2022-11-04 DIAGNOSIS — M5416 Radiculopathy, lumbar region: Secondary | ICD-10-CM | POA: Diagnosis not present

## 2022-11-04 DIAGNOSIS — M9901 Segmental and somatic dysfunction of cervical region: Secondary | ICD-10-CM | POA: Diagnosis not present

## 2022-11-04 DIAGNOSIS — M533 Sacrococcygeal disorders, not elsewhere classified: Secondary | ICD-10-CM | POA: Diagnosis not present

## 2022-11-04 DIAGNOSIS — M9903 Segmental and somatic dysfunction of lumbar region: Secondary | ICD-10-CM | POA: Diagnosis not present

## 2022-11-04 DIAGNOSIS — M530 Cervicocranial syndrome: Secondary | ICD-10-CM | POA: Diagnosis not present

## 2022-11-04 DIAGNOSIS — M9904 Segmental and somatic dysfunction of sacral region: Secondary | ICD-10-CM | POA: Diagnosis not present

## 2022-12-02 DIAGNOSIS — Z01411 Encounter for gynecological examination (general) (routine) with abnormal findings: Secondary | ICD-10-CM | POA: Diagnosis not present

## 2022-12-02 DIAGNOSIS — M533 Sacrococcygeal disorders, not elsewhere classified: Secondary | ICD-10-CM | POA: Diagnosis not present

## 2022-12-02 DIAGNOSIS — M9903 Segmental and somatic dysfunction of lumbar region: Secondary | ICD-10-CM | POA: Diagnosis not present

## 2022-12-02 DIAGNOSIS — M530 Cervicocranial syndrome: Secondary | ICD-10-CM | POA: Diagnosis not present

## 2022-12-02 DIAGNOSIS — M9904 Segmental and somatic dysfunction of sacral region: Secondary | ICD-10-CM | POA: Diagnosis not present

## 2022-12-02 DIAGNOSIS — M9901 Segmental and somatic dysfunction of cervical region: Secondary | ICD-10-CM | POA: Diagnosis not present

## 2022-12-02 DIAGNOSIS — M5416 Radiculopathy, lumbar region: Secondary | ICD-10-CM | POA: Diagnosis not present

## 2022-12-02 DIAGNOSIS — N958 Other specified menopausal and perimenopausal disorders: Secondary | ICD-10-CM | POA: Diagnosis not present

## 2022-12-02 DIAGNOSIS — Z113 Encounter for screening for infections with a predominantly sexual mode of transmission: Secondary | ICD-10-CM | POA: Diagnosis not present

## 2023-02-09 DIAGNOSIS — Z03818 Encounter for observation for suspected exposure to other biological agents ruled out: Secondary | ICD-10-CM | POA: Diagnosis not present

## 2023-02-09 DIAGNOSIS — U071 COVID-19: Secondary | ICD-10-CM | POA: Diagnosis not present

## 2023-02-09 DIAGNOSIS — R0981 Nasal congestion: Secondary | ICD-10-CM | POA: Diagnosis not present

## 2023-03-23 DIAGNOSIS — Z8659 Personal history of other mental and behavioral disorders: Secondary | ICD-10-CM | POA: Diagnosis not present

## 2023-03-23 DIAGNOSIS — R7303 Prediabetes: Secondary | ICD-10-CM | POA: Diagnosis not present

## 2023-03-23 DIAGNOSIS — E611 Iron deficiency: Secondary | ICD-10-CM | POA: Diagnosis not present

## 2023-03-23 DIAGNOSIS — E782 Mixed hyperlipidemia: Secondary | ICD-10-CM | POA: Diagnosis not present

## 2023-03-23 DIAGNOSIS — Z0000 Encounter for general adult medical examination without abnormal findings: Secondary | ICD-10-CM | POA: Diagnosis not present

## 2023-03-23 DIAGNOSIS — Z79899 Other long term (current) drug therapy: Secondary | ICD-10-CM | POA: Diagnosis not present

## 2023-04-25 DIAGNOSIS — R42 Dizziness and giddiness: Secondary | ICD-10-CM | POA: Diagnosis not present

## 2023-04-25 DIAGNOSIS — H6121 Impacted cerumen, right ear: Secondary | ICD-10-CM | POA: Diagnosis not present

## 2023-04-25 DIAGNOSIS — Z23 Encounter for immunization: Secondary | ICD-10-CM | POA: Diagnosis not present

## 2023-06-12 DIAGNOSIS — Z79899 Other long term (current) drug therapy: Secondary | ICD-10-CM | POA: Diagnosis not present

## 2023-06-12 DIAGNOSIS — E782 Mixed hyperlipidemia: Secondary | ICD-10-CM | POA: Diagnosis not present

## 2023-08-25 ENCOUNTER — Other Ambulatory Visit: Payer: Self-pay | Admitting: Internal Medicine

## 2023-08-25 DIAGNOSIS — Z1231 Encounter for screening mammogram for malignant neoplasm of breast: Secondary | ICD-10-CM

## 2023-09-18 DIAGNOSIS — E782 Mixed hyperlipidemia: Secondary | ICD-10-CM | POA: Diagnosis not present

## 2023-09-18 DIAGNOSIS — Z79899 Other long term (current) drug therapy: Secondary | ICD-10-CM | POA: Diagnosis not present

## 2023-10-02 ENCOUNTER — Ambulatory Visit
Admission: RE | Admit: 2023-10-02 | Discharge: 2023-10-02 | Disposition: A | Discharge status: Home / Self Care | Source: Ambulatory Visit | Attending: Internal Medicine | Admitting: Internal Medicine

## 2023-10-02 DIAGNOSIS — Z1231 Encounter for screening mammogram for malignant neoplasm of breast: Secondary | ICD-10-CM | POA: Diagnosis not present

## 2023-12-05 DIAGNOSIS — Z113 Encounter for screening for infections with a predominantly sexual mode of transmission: Secondary | ICD-10-CM | POA: Diagnosis not present

## 2023-12-05 DIAGNOSIS — Z01419 Encounter for gynecological examination (general) (routine) without abnormal findings: Secondary | ICD-10-CM | POA: Diagnosis not present

## 2023-12-05 DIAGNOSIS — Z1151 Encounter for screening for human papillomavirus (HPV): Secondary | ICD-10-CM | POA: Diagnosis not present

## 2023-12-05 DIAGNOSIS — Z1331 Encounter for screening for depression: Secondary | ICD-10-CM | POA: Diagnosis not present

## 2023-12-05 DIAGNOSIS — Z1339 Encounter for screening examination for other mental health and behavioral disorders: Secondary | ICD-10-CM | POA: Diagnosis not present

## 2023-12-05 DIAGNOSIS — Z124 Encounter for screening for malignant neoplasm of cervix: Secondary | ICD-10-CM | POA: Diagnosis not present

## 2024-01-26 DIAGNOSIS — M531 Cervicobrachial syndrome: Secondary | ICD-10-CM | POA: Diagnosis not present

## 2024-01-26 DIAGNOSIS — M9901 Segmental and somatic dysfunction of cervical region: Secondary | ICD-10-CM | POA: Diagnosis not present

## 2024-01-26 DIAGNOSIS — M5418 Radiculopathy, sacral and sacrococcygeal region: Secondary | ICD-10-CM | POA: Diagnosis not present

## 2024-01-26 DIAGNOSIS — M5451 Vertebrogenic low back pain: Secondary | ICD-10-CM | POA: Diagnosis not present

## 2024-01-26 DIAGNOSIS — M9904 Segmental and somatic dysfunction of sacral region: Secondary | ICD-10-CM | POA: Diagnosis not present

## 2024-01-26 DIAGNOSIS — M9903 Segmental and somatic dysfunction of lumbar region: Secondary | ICD-10-CM | POA: Diagnosis not present

## 2024-01-26 DIAGNOSIS — M25512 Pain in left shoulder: Secondary | ICD-10-CM | POA: Diagnosis not present

## 2024-03-25 DIAGNOSIS — E782 Mixed hyperlipidemia: Secondary | ICD-10-CM | POA: Diagnosis not present

## 2024-03-25 DIAGNOSIS — Z23 Encounter for immunization: Secondary | ICD-10-CM | POA: Diagnosis not present

## 2024-03-25 DIAGNOSIS — Z0000 Encounter for general adult medical examination without abnormal findings: Secondary | ICD-10-CM | POA: Diagnosis not present

## 2024-03-25 DIAGNOSIS — Z79899 Other long term (current) drug therapy: Secondary | ICD-10-CM | POA: Diagnosis not present

## 2024-03-25 DIAGNOSIS — Z1331 Encounter for screening for depression: Secondary | ICD-10-CM | POA: Diagnosis not present

## 2024-03-25 DIAGNOSIS — R7303 Prediabetes: Secondary | ICD-10-CM | POA: Diagnosis not present
